# Patient Record
Sex: Female | Born: 1970 | Race: White | Hispanic: No | Smoking: Never smoker
Health system: Southern US, Community
[De-identification: ages and names within clinical notes are randomized; demographics above are authoritative.]

## PROBLEM LIST (undated history)

## (undated) DIAGNOSIS — D649 Anemia, unspecified: Secondary | ICD-10-CM

## (undated) DIAGNOSIS — D6851 Activated protein C resistance: Secondary | ICD-10-CM

## (undated) HISTORY — PX: WISDOM TOOTH EXTRACTION: SHX21

## (undated) HISTORY — DX: Anemia, unspecified: D64.9

## (undated) HISTORY — DX: Activated protein C resistance: D68.51

---
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
## 2007-08-01 HISTORY — PX: COLONOSCOPY: SHX174

## 2017-06-20 ENCOUNTER — Other Ambulatory Visit: Payer: Self-pay | Admitting: Obstetrics and Gynecology

## 2017-06-20 DIAGNOSIS — N6310 Unspecified lump in the right breast, unspecified quadrant: Secondary | ICD-10-CM

## 2017-06-29 ENCOUNTER — Other Ambulatory Visit: Payer: Self-pay

## 2017-07-06 ENCOUNTER — Ambulatory Visit
Admission: RE | Admit: 2017-07-06 | Discharge: 2017-07-06 | Disposition: A | Discharge status: Home / Self Care | Payer: BLUE CROSS/BLUE SHIELD | Source: Ambulatory Visit | Attending: Obstetrics and Gynecology | Admitting: Obstetrics and Gynecology

## 2017-07-06 ENCOUNTER — Other Ambulatory Visit: Payer: Self-pay | Admitting: Obstetrics and Gynecology

## 2017-07-06 DIAGNOSIS — N6310 Unspecified lump in the right breast, unspecified quadrant: Secondary | ICD-10-CM

## 2017-12-14 ENCOUNTER — Ambulatory Visit
Admission: RE | Admit: 2017-12-14 | Discharge: 2017-12-14 | Disposition: A | Discharge status: Home / Self Care | Payer: BLUE CROSS/BLUE SHIELD | Source: Ambulatory Visit | Attending: Obstetrics and Gynecology | Admitting: Obstetrics and Gynecology

## 2017-12-14 ENCOUNTER — Other Ambulatory Visit: Payer: Self-pay | Admitting: Obstetrics and Gynecology

## 2017-12-14 DIAGNOSIS — N6310 Unspecified lump in the right breast, unspecified quadrant: Secondary | ICD-10-CM

## 2017-12-21 ENCOUNTER — Encounter: Payer: Self-pay | Admitting: *Deleted

## 2017-12-28 ENCOUNTER — Inpatient Hospital Stay: Payer: BLUE CROSS/BLUE SHIELD | Attending: Gynecologic Oncology | Admitting: Gynecologic Oncology

## 2017-12-28 ENCOUNTER — Encounter: Payer: Self-pay | Admitting: Gynecologic Oncology

## 2017-12-28 VITALS — BP 137/75 | HR 57 | Temp 98.2°F | Resp 20 | Ht 66.0 in | Wt 124.0 lb

## 2017-12-28 DIAGNOSIS — N83202 Unspecified ovarian cyst, left side: Secondary | ICD-10-CM | POA: Diagnosis present

## 2017-12-28 DIAGNOSIS — D6851 Activated protein C resistance: Secondary | ICD-10-CM | POA: Diagnosis not present

## 2017-12-28 NOTE — Progress Notes (Signed)
Consult Note: Gyn-Onc  Consult was requested by Dr. Ambrose MantleHenley for the evaluation of Debra Wilkerson 47 y.o. female  CC:  Chief Complaint  Patient presents with  . Cyst of left ovary    Assessment/Plan:  [name]  is a 47 y.o.  year old with a 3+ centimeters complex left ovarian cyst, normal Ca1 25, no symptoms.  Patient has a history of factor V Leiden.  I discussed with Ms.Kopko and her husband that I have a low suspicion that this ovarian mass is malignant.  However given the complex features ultrasounds he requires at least one follow-up ultrasound in 3 months time to re-observe for stability if the patient elects for nonsurgical management.  I offered general surgery with robotic assisted unilateral salpingo-oophorectomy possible BSO and hysterectomy and staging if malignancy is found.  I discussed risks of surgery including  bleeding, infection, damage to internal organs (such as bladder,ureters, bowels), blood clot, reoperation and rehospitalization.  I discussed that her risk of VTE or stroke is elevated due to factor V Leiden insufficiency and therefore she would require postoperative anticoagulant prophylaxis, which increases her risk of postoperative complications.  However she is thin and otherwise healthy with minimum prior surgical history and therefore overall surgical risk I believe is low.  Conversely alternative to surgery at this time would be a repeat ultrasound scan with Dr. Betsy PriesHanley in 3 months.  If the ovarian mass remains stable a second ultrasound that 6 months or cessation of screening if patient's wishes.    Patient is weighing her options but at this time is electing for expectant management repeat ultrasound scan with Dr. Betsy PriesHanley.  She will notify the office of this and return August 2019 for repeat ultrasound scan.  I expressed to her that she changes her mind to proceed with definitive surgery diagnosis be happy to accommodate this time.   HPI: Debra Wilkerson is a  47 year old P1 who was seen in consultation at the request of Dr. Ambrose MantleHenley for a left ovarian cyst mass.  The patient was evaluated in December 2019 and was noted to have a fibroid uterus on examination confirmed with an ultrasound scan.  She was recommended to have a follow-up ultrasound scan few months after that given that this was a new finding, and therefore she followed up in May 2019 for repeat ultrasound scan.  At that time the uterus fibroids remain unchanged (measuring 6 x 6.8 x 5.4 cm).  The endometrium was 5 mm thick.  The left ovary measured 4 x 3.8 x 4.3 cm and contained a 3.3 x 3.4 x 3.4 cm complex avascular mass, possible endometrioma.  There is a possible area of adenomyosis in the uterus.  The right ovary was normal.  Ca1 25 was drawn on Dec 19, 2017 and was normal at 21.7.  Of note patient underwent an endometrial biopsy in December 20 revealed normal endometrium.  Debra Wilkerson is a healthy woman she is thin active runner.  His father has a history of prostate cancer.  The patient has been tested for rheumatoid factor V Leiden insufficiency.  She is never had a stroke or VTE, however she is also never used birth control pills.  She was tested for this when she reported that her father had a stroke at a young age well.  Past surgical history significant for an appendectomy in 1989.  She has had one prior vaginal delivery of a 9).  She is a remote history of an abnormal cervical Pap smear, however  her most recent Pap smear on June 19, 2017 was normal  Current Meds:  No outpatient encounter medications on file as of 12/28/2017.   No facility-administered encounter medications on file as of 12/28/2017.     Allergy:  Allergies  Allergen Reactions  . Sulfa Antibiotics     Vomiting    Social Hx:   Social History   Socioeconomic History  . Marital status: Married    Spouse name: Not on file  . Number of children: Not on file  . Years of education: Not on file  . Highest education  level: Not on file  Occupational History  . Not on file  Social Needs  . Financial resource strain: Not on file  . Food insecurity:    Worry: Not on file    Inability: Not on file  . Transportation needs:    Medical: Not on file    Non-medical: Not on file  Tobacco Use  . Smoking status: Never Smoker  . Smokeless tobacco: Never Used  Substance and Sexual Activity  . Alcohol use: Yes    Comment: Ocassional  . Drug use: Never  . Sexual activity: Not on file  Lifestyle  . Physical activity:    Days per week: Not on file    Minutes per session: Not on file  . Stress: Not on file  Relationships  . Social connections:    Talks on phone: Not on file    Gets together: Not on file    Attends religious service: Not on file    Active member of club or organization: Not on file    Attends meetings of clubs or organizations: Not on file    Relationship status: Not on file  . Intimate partner violence:    Fear of current or ex partner: Not on file    Emotionally abused: Not on file    Physically abused: Not on file    Forced sexual activity: Not on file  Other Topics Concern  . Not on file  Social History Narrative  . Not on file    Past Surgical Hx:  Past Surgical History:  Procedure Laterality Date  . APPENDECTOMY  1988  . COLONOSCOPY  2009  . WISDOM TOOTH EXTRACTION     2003    Past Medical Hx:  Past Medical History:  Diagnosis Date  . Anemia   . Factor 5 Leiden mutation, heterozygous (HCC)    App. 2001    Past Gynecological History:  SVD x 1, remote history of prior abnormal pap (recent normal). Normal regular cycles.  No LMP recorded.  Family Hx:  Family History  Problem Relation Age of Onset  . Prostate cancer Father   . Hypertension Father   . Coronary artery disease Father     Review of Systems:  Constitutional  Feels well,    ENT Normal appearing ears and nares bilaterally Skin/Breast  No rash, sores, jaundice, itching, dryness Cardiovascular   No chest pain, shortness of breath, or edema  Pulmonary  No cough or wheeze.  Gastro Intestinal  No nausea, vomitting, or diarrhoea. No bright red blood per rectum, no abdominal pain, change in bowel movement, or constipation.  Genito Urinary  No frequency, urgency, dysuria,  Musculo Skeletal  No myalgia, arthralgia, joint swelling or pain  Neurologic  No weakness, numbness, change in gait,  Psychology  No depression, anxiety, insomnia.   Vitals:  Blood pressure 137/75, pulse (!) 57, temperature 98.2 F (36.8 C), temperature source Oral, resp.  rate 20, height 5\' 6"  (1.676 m), weight 124 lb (56.2 kg), SpO2 100 %.  Physical Exam: WD in NAD Neck  Supple NROM, without any enlargements.  Lymph Node Survey No cervical supraclavicular or inguinal adenopathy Cardiovascular  Pulse normal rate, regularity and rhythm. S1 and S2 normal.  Lungs  Clear to auscultation bilateraly, without wheezes/crackles/rhonchi. Good air movement.  Skin  No rash/lesions/breakdown  Psychiatry  Alert and oriented to person, place, and time  Abdomen  Normoactive bowel sounds, abdomen soft, non-tender and thin without evidence of hernia.  Back No CVA tenderness Genito Urinary  Vulva/vagina: Normal external female genitalia.   No lesions. No discharge or bleeding.  Bladder/urethra:  No lesions or masses, well supported bladder  Vagina: normal  Cervix: Normal appearing, no lesions.  Uterus:  Small, mobile, no parametrial involvement or nodularity.  Adnexa: no palpable masses. Rectal  deferred  Extremities  No bilateral cyanosis, clubbing or edema.   Luisa Dago, MD  12/28/2017, 6:24 PM

## 2017-12-28 NOTE — Patient Instructions (Addendum)
Dr Andrey Farmer is offering either surgical removal of the left tube and ovary or close follow-up with repeat ultrasound in 3 months.  She has a low suspicion that this is cancer.   Plan to have a repeat ultrasound with Dr. Ambrose Mantle in three months time.  Please call our office for any questions or concerns.  Please contact Dr Oliver Hum office if you desire surgery or with questions [phone]).  Please contact Dr [facility] office to schedule an ultrasound for August, 2019.

## 2018-06-21 ENCOUNTER — Inpatient Hospital Stay
Admission: RE | Admit: 2018-06-21 | Discharge: 2018-06-21 | Disposition: A | Discharge status: Home / Self Care | Payer: BLUE CROSS/BLUE SHIELD | Source: Ambulatory Visit | Attending: Obstetrics and Gynecology | Admitting: Obstetrics and Gynecology

## 2018-07-19 ENCOUNTER — Other Ambulatory Visit: Payer: Self-pay | Admitting: Obstetrics and Gynecology

## 2018-07-19 ENCOUNTER — Ambulatory Visit
Admission: RE | Admit: 2018-07-19 | Discharge: 2018-07-19 | Disposition: A | Discharge status: Home / Self Care | Payer: BLUE CROSS/BLUE SHIELD | Source: Ambulatory Visit | Attending: Obstetrics and Gynecology | Admitting: Obstetrics and Gynecology

## 2018-07-19 DIAGNOSIS — N6310 Unspecified lump in the right breast, unspecified quadrant: Secondary | ICD-10-CM

## 2019-01-24 ENCOUNTER — Other Ambulatory Visit: Payer: BLUE CROSS/BLUE SHIELD

## 2019-05-30 ENCOUNTER — Ambulatory Visit
Admission: RE | Admit: 2019-05-30 | Discharge: 2019-05-30 | Disposition: A | Discharge status: Home / Self Care | Payer: BC Managed Care – PPO | Source: Ambulatory Visit | Attending: Obstetrics and Gynecology | Admitting: Obstetrics and Gynecology

## 2019-05-30 ENCOUNTER — Other Ambulatory Visit: Payer: Self-pay

## 2019-05-30 DIAGNOSIS — N6310 Unspecified lump in the right breast, unspecified quadrant: Secondary | ICD-10-CM

## 2020-06-07 ENCOUNTER — Other Ambulatory Visit: Payer: Self-pay | Admitting: Obstetrics and Gynecology

## 2020-06-07 DIAGNOSIS — Z1231 Encounter for screening mammogram for malignant neoplasm of breast: Secondary | ICD-10-CM

## 2020-07-15 ENCOUNTER — Other Ambulatory Visit: Payer: Self-pay

## 2020-07-15 ENCOUNTER — Ambulatory Visit
Admission: RE | Admit: 2020-07-15 | Discharge: 2020-07-15 | Disposition: A | Discharge status: Home / Self Care | Payer: PRIVATE HEALTH INSURANCE | Source: Ambulatory Visit | Attending: Obstetrics and Gynecology | Admitting: Obstetrics and Gynecology

## 2020-07-15 DIAGNOSIS — Z1231 Encounter for screening mammogram for malignant neoplasm of breast: Secondary | ICD-10-CM

## 2021-07-08 ENCOUNTER — Other Ambulatory Visit: Payer: Self-pay | Admitting: Obstetrics and Gynecology

## 2021-07-08 DIAGNOSIS — Z1231 Encounter for screening mammogram for malignant neoplasm of breast: Secondary | ICD-10-CM

## 2021-08-09 ENCOUNTER — Ambulatory Visit
Admission: RE | Admit: 2021-08-09 | Discharge: 2021-08-09 | Disposition: A | Discharge status: Home / Self Care | Payer: BC Managed Care – PPO | Source: Ambulatory Visit

## 2021-08-09 DIAGNOSIS — Z1231 Encounter for screening mammogram for malignant neoplasm of breast: Secondary | ICD-10-CM

## 2022-08-08 ENCOUNTER — Other Ambulatory Visit: Payer: Self-pay | Admitting: Obstetrics and Gynecology

## 2022-08-08 DIAGNOSIS — Z1231 Encounter for screening mammogram for malignant neoplasm of breast: Secondary | ICD-10-CM

## 2022-09-29 ENCOUNTER — Ambulatory Visit
Admission: RE | Admit: 2022-09-29 | Discharge: 2022-09-29 | Disposition: A | Discharge status: Home / Self Care | Payer: BC Managed Care – PPO | Source: Ambulatory Visit | Attending: Obstetrics and Gynecology | Admitting: Obstetrics and Gynecology

## 2022-09-29 DIAGNOSIS — Z1231 Encounter for screening mammogram for malignant neoplasm of breast: Secondary | ICD-10-CM

## 2022-10-10 ENCOUNTER — Other Ambulatory Visit: Payer: Self-pay | Admitting: Obstetrics and Gynecology

## 2022-10-10 DIAGNOSIS — R92343 Mammographic extreme density, bilateral breasts: Secondary | ICD-10-CM

## 2023-04-06 ENCOUNTER — Ambulatory Visit
Admission: RE | Admit: 2023-04-06 | Discharge: 2023-04-06 | Disposition: A | Discharge status: Home / Self Care | Payer: BC Managed Care – PPO | Source: Ambulatory Visit | Attending: Obstetrics and Gynecology | Admitting: Obstetrics and Gynecology

## 2023-04-06 DIAGNOSIS — R92343 Mammographic extreme density, bilateral breasts: Secondary | ICD-10-CM

## 2023-04-06 MED ORDER — GADOPICLENOL 0.5 MMOL/ML IV SOLN
6.0000 mL | Freq: Once | INTRAVENOUS | Status: AC | PRN
  Administered 2023-04-06: 6 mL via INTRAVENOUS

## 2023-09-03 IMAGING — MG MM DIGITAL SCREENING BILAT W/ TOMO AND CAD
8 series · 9 of 24 positions shown · non-contrast
Comparison: Previous exam(s).

CLINICAL DATA: Screening.

EXAM:
DIGITAL SCREENING BILATERAL MAMMOGRAM WITH TOMOSYNTHESIS AND CAD
TECHNIQUE: Bilateral screening digital craniocaudal and mediolateral oblique
mammograms were obtained. Bilateral screening digital breast
tomosynthesis was performed. The images were evaluated with
computer-aided detection.

[R CC synth-2D]
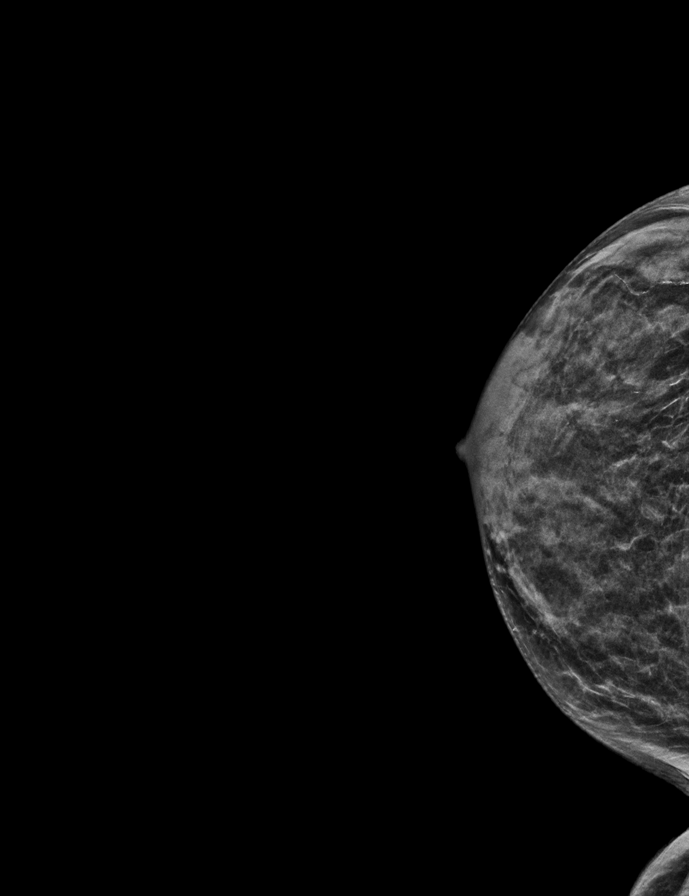

[R MLO synth-2D]
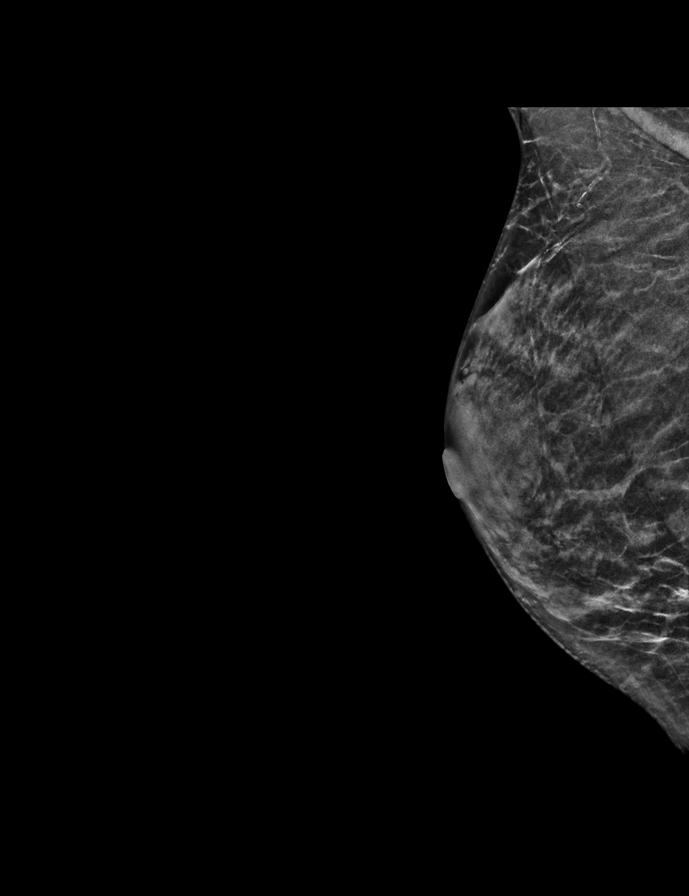

[L CC synth-2D]
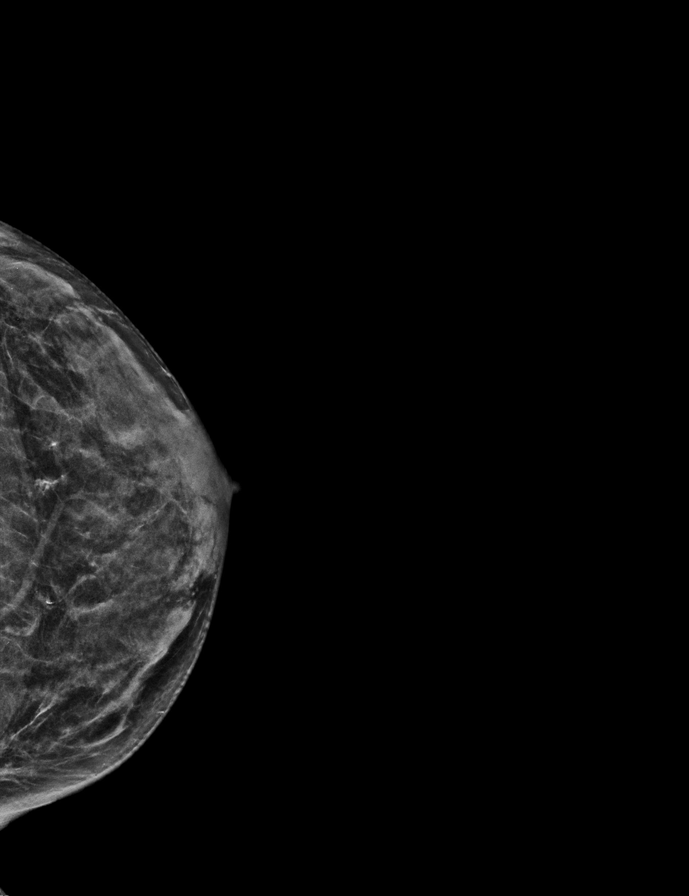

[L MLO synth-2D]
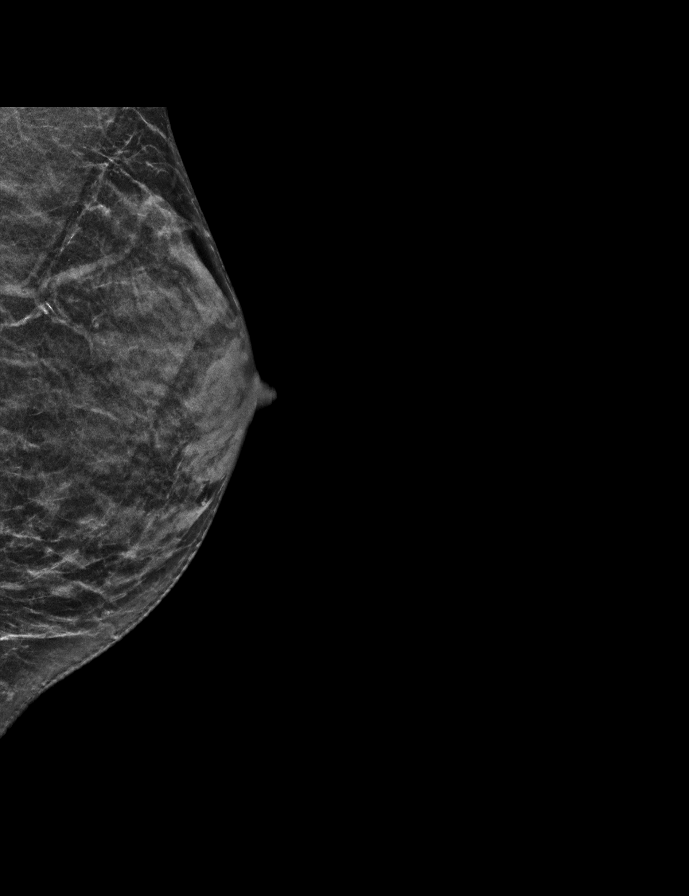

[R CC tomo · 2 of 41 frames shown]
[frame 14/41]
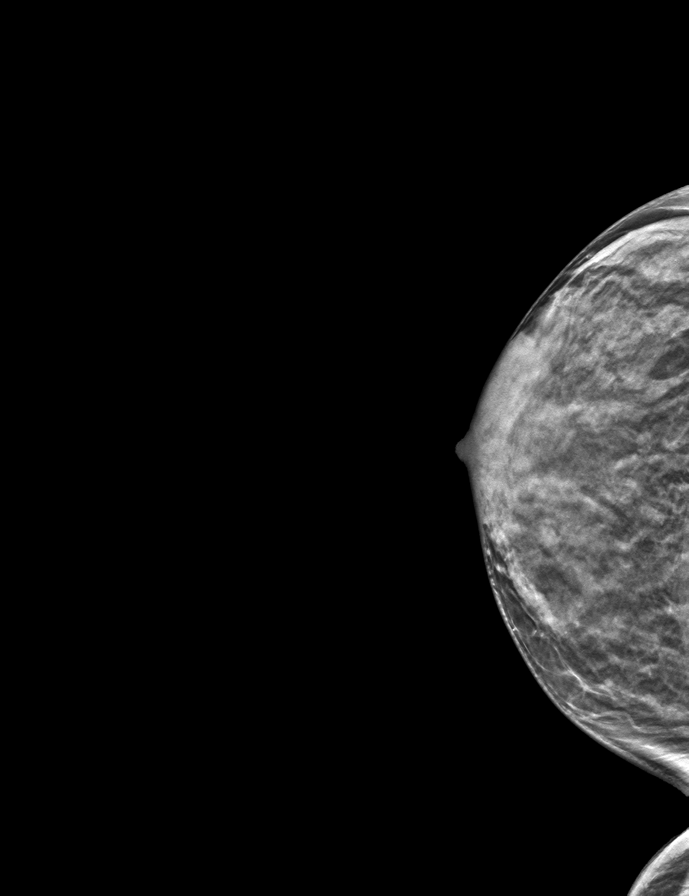
[frame 21/41]
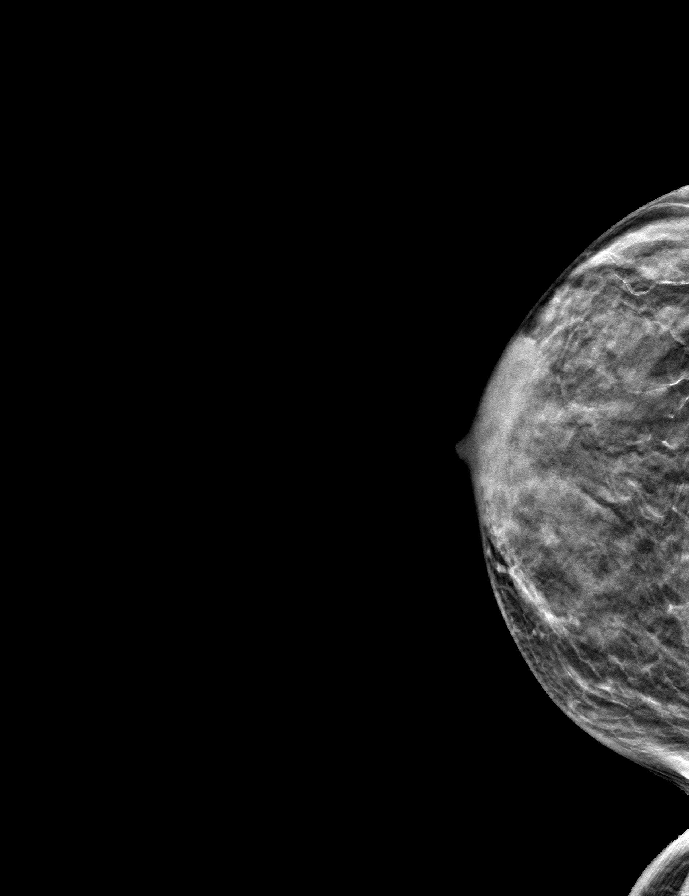

[L MLO tomo · tomo slice 21/40.0]
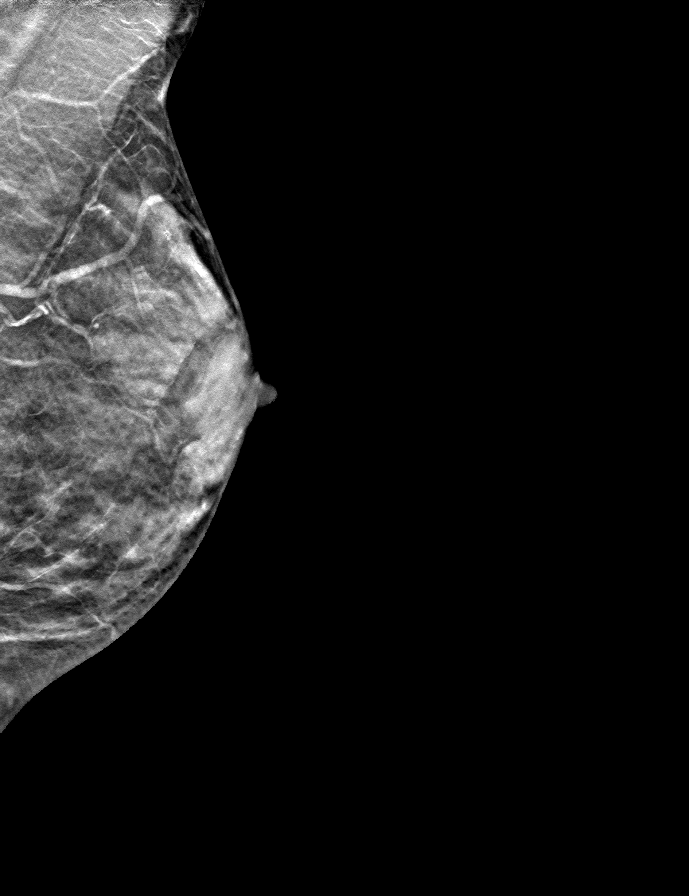

[R MLO tomo · tomo slice 23/45.0]
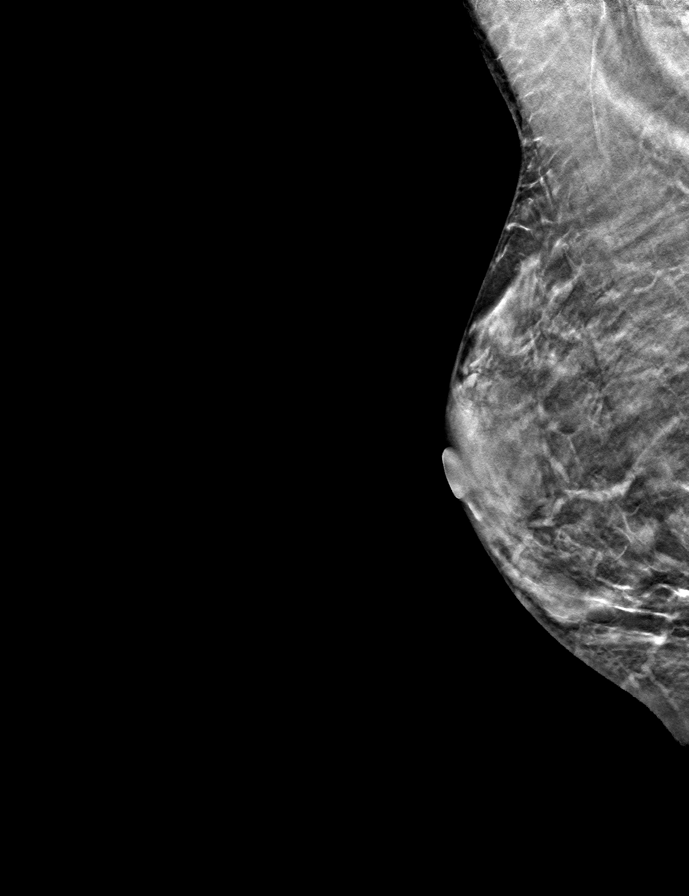

[L CC tomo · tomo slice 23/45.0]
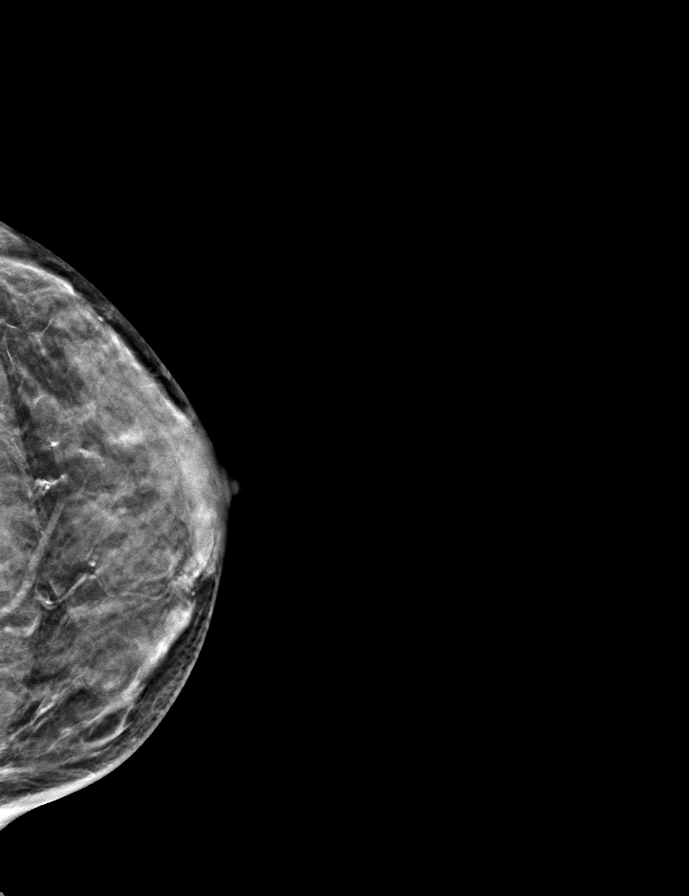

[9 of 24 positions shown; findings below may reference images not displayed]

ACR Breast Density Category c: The breast tissue is heterogeneously
dense, which may obscure small masses.
FINDINGS: There are no findings suspicious for malignancy.
IMPRESSION: No mammographic evidence of malignancy. A result letter of this
screening mammogram will be mailed directly to the patient.

RECOMMENDATION:
Screening mammogram in one year. (Code:Q3-W-BC3)

BI-RADS CATEGORY  1: Negative.

## 2023-09-10 ENCOUNTER — Other Ambulatory Visit: Payer: Self-pay | Admitting: Obstetrics and Gynecology

## 2023-09-10 ENCOUNTER — Other Ambulatory Visit (HOSPITAL_COMMUNITY): Payer: Self-pay | Admitting: Internal Medicine

## 2023-09-10 DIAGNOSIS — E7800 Pure hypercholesterolemia, unspecified: Secondary | ICD-10-CM

## 2023-09-10 DIAGNOSIS — Z1231 Encounter for screening mammogram for malignant neoplasm of breast: Secondary | ICD-10-CM

## 2023-09-25 ENCOUNTER — Ambulatory Visit (HOSPITAL_COMMUNITY)
Admission: RE | Admit: 2023-09-25 | Discharge: 2023-09-25 | Disposition: A | Discharge status: Home / Self Care | Payer: Self-pay | Source: Ambulatory Visit | Attending: Internal Medicine | Admitting: Internal Medicine

## 2023-09-25 DIAGNOSIS — E7800 Pure hypercholesterolemia, unspecified: Secondary | ICD-10-CM | POA: Insufficient documentation

## 2023-10-05 ENCOUNTER — Ambulatory Visit
Admission: RE | Admit: 2023-10-05 | Discharge: 2023-10-05 | Disposition: A | Discharge status: Home / Self Care | Payer: PRIVATE HEALTH INSURANCE | Source: Ambulatory Visit | Attending: Obstetrics and Gynecology | Admitting: Obstetrics and Gynecology

## 2023-10-05 DIAGNOSIS — Z1231 Encounter for screening mammogram for malignant neoplasm of breast: Secondary | ICD-10-CM
